# Patient Record
Sex: Female | Born: 1970 | Race: White | Hispanic: No
Health system: Southern US, Community
[De-identification: ages and names within clinical notes are randomized; demographics above are authoritative.]

---

## 2022-05-24 ENCOUNTER — Other Ambulatory Visit: Payer: Self-pay | Admitting: Orthopedic Surgery

## 2022-05-24 ENCOUNTER — Other Ambulatory Visit (HOSPITAL_COMMUNITY): Payer: Self-pay | Admitting: Orthopedic Surgery

## 2022-05-24 DIAGNOSIS — T84031A Mechanical loosening of internal left hip prosthetic joint, initial encounter: Secondary | ICD-10-CM

## 2022-05-24 DIAGNOSIS — T85698A Other mechanical complication of other specified internal prosthetic devices, implants and grafts, initial encounter: Secondary | ICD-10-CM

## 2022-06-01 ENCOUNTER — Encounter (HOSPITAL_COMMUNITY)
Admission: RE | Admit: 2022-06-01 | Discharge: 2022-06-01 | Disposition: A | Discharge status: Home / Self Care | Payer: 59 | Source: Ambulatory Visit | Attending: Orthopedic Surgery | Admitting: Orthopedic Surgery

## 2022-06-01 DIAGNOSIS — T85698A Other mechanical complication of other specified internal prosthetic devices, implants and grafts, initial encounter: Secondary | ICD-10-CM | POA: Insufficient documentation

## 2022-06-01 DIAGNOSIS — T84031A Mechanical loosening of internal left hip prosthetic joint, initial encounter: Secondary | ICD-10-CM | POA: Diagnosis present

## 2022-06-01 MED ORDER — TECHNETIUM TC 99M MEDRONATE IV KIT
20.0000 | PACK | Freq: Once | INTRAVENOUS | Status: AC | PRN
  Administered 2022-06-01: 20 via INTRAVENOUS

## 2022-09-04 ENCOUNTER — Other Ambulatory Visit (HOSPITAL_COMMUNITY): Payer: Self-pay | Admitting: Orthopedic Surgery

## 2022-09-04 ENCOUNTER — Other Ambulatory Visit: Payer: Self-pay | Admitting: Orthopedic Surgery

## 2022-09-04 DIAGNOSIS — T84031S Mechanical loosening of internal left hip prosthetic joint, sequela: Secondary | ICD-10-CM

## 2022-09-13 ENCOUNTER — Ambulatory Visit (HOSPITAL_COMMUNITY)
Admission: RE | Admit: 2022-09-13 | Discharge: 2022-09-13 | Disposition: A | Discharge status: Home / Self Care | Payer: 59 | Source: Ambulatory Visit | Attending: Orthopedic Surgery | Admitting: Orthopedic Surgery

## 2022-09-13 ENCOUNTER — Encounter (HOSPITAL_COMMUNITY)
Admission: RE | Admit: 2022-09-13 | Discharge: 2022-09-13 | Disposition: A | Discharge status: Home / Self Care | Payer: 59 | Source: Ambulatory Visit | Attending: Orthopedic Surgery | Admitting: Orthopedic Surgery

## 2022-09-13 DIAGNOSIS — T84031S Mechanical loosening of internal left hip prosthetic joint, sequela: Secondary | ICD-10-CM | POA: Insufficient documentation

## 2022-09-13 DIAGNOSIS — T84031A Mechanical loosening of internal left hip prosthetic joint, initial encounter: Secondary | ICD-10-CM | POA: Insufficient documentation

## 2022-09-13 DIAGNOSIS — X58XXXA Exposure to other specified factors, initial encounter: Secondary | ICD-10-CM | POA: Diagnosis not present

## 2022-09-13 MED ORDER — TECHNETIUM TC 99M MEDRONATE IV KIT
20.5000 | PACK | Freq: Once | INTRAVENOUS | Status: AC | PRN
  Administered 2022-09-13: 20.5 via INTRAVENOUS

## 2023-01-05 ENCOUNTER — Other Ambulatory Visit: Payer: Self-pay | Admitting: Orthopedic Surgery

## 2023-01-05 DIAGNOSIS — T148XXA Other injury of unspecified body region, initial encounter: Secondary | ICD-10-CM

## 2023-01-19 ENCOUNTER — Ambulatory Visit
Admission: RE | Admit: 2023-01-19 | Discharge: 2023-01-19 | Disposition: A | Discharge status: Home / Self Care | Payer: 59 | Source: Ambulatory Visit | Attending: Orthopedic Surgery | Admitting: Orthopedic Surgery

## 2023-01-19 ENCOUNTER — Encounter: Payer: Self-pay | Admitting: Orthopedic Surgery

## 2023-01-19 DIAGNOSIS — T148XXA Other injury of unspecified body region, initial encounter: Secondary | ICD-10-CM

## 2023-02-26 ENCOUNTER — Encounter: Payer: Self-pay | Admitting: Neurology

## 2023-02-26 ENCOUNTER — Other Ambulatory Visit: Payer: Self-pay

## 2023-02-26 DIAGNOSIS — R202 Paresthesia of skin: Secondary | ICD-10-CM

## 2023-03-22 ENCOUNTER — Ambulatory Visit (INDEPENDENT_AMBULATORY_CARE_PROVIDER_SITE_OTHER): Payer: 59 | Admitting: Neurology

## 2023-03-22 DIAGNOSIS — R202 Paresthesia of skin: Secondary | ICD-10-CM | POA: Diagnosis not present

## 2023-03-22 NOTE — Procedures (Signed)
  [facility]  [place], [place]  [place], [place] [address] Tel: [phone] Fax: [phone] Test Date:  03/22/2023  Patient: Katie Washington DOB: June 29, 1971 Physician: Narda Amber, DO  Sex: Female Height: 5\' 5"  Ref Phys: Charlies Constable, MD  ID#: [id number]   Technician:    History: This is a 52 year old female referred for evaluation of left thigh pain.  NCV & EMG Findings: Extensive electrodiagnostic testing of the left lower extremity shows:  Left sural, superficial peroneal, and lateral femoral cutaneous sensory responses are within normal limits. Left peroneal and tibial motor responses are within normal limits. Left tibial H reflex study is within normal limits. There is no evidence of active or chronic motor axonal loss changes affecting any of the tested muscles.  Motor unit configuration and recruitment pattern is within normal limits.  Impression: This is a normal study of the left lower extremity.  In particular, there is no evidence of a lumbosacral radiculopathy, lateral femoral cutaneous sensory neuropathy, or large fiber sensorimotor polyneuropathy.   ___________________________ Narda Amber, DO    Nerve Conduction Studies   Stim Site NR Peak (ms) Norm Peak (ms) O-P Amp (V) Norm O-P Amp  Left Lat Femoral Cutan Anti Sensory (Lateral Thigh)  32 C  ASIS    2.5 <3.1 11.4 >10  Left Sup Peroneal Anti Sensory (Ant Lat Mall)  32 C  12 cm    1.9 <4.6 12.6 >4  Left Sural Anti Sensory (Lat Mall)  32 C  Calf    2.6 <4.6 12.5 >4     Stim Site NR Onset (ms) Norm Onset (ms) O-P Amp (mV) Norm O-P Amp Site1 Site2 Delta-0 (ms) Dist (cm) Vel (m/s) Norm Vel (m/s)  Left Peroneal Motor (Ext Dig Brev)  32 C  Ankle    3.1 <6.0 2.6 >2.5 B Fib Ankle 7.7 38.0 49 >40  B Fib    10.8  2.3  Poplt B Fib 1.5 8.0 53 >40  Poplt    12.3  2.0         Left Tibial Motor (Abd Hall Brev)  32 C  Ankle    3.1 <6.0 21.3 >4 Knee Ankle 8.5 43.0 51 >40  Knee    11.6   17.0          Electromyography   Side Muscle Ins.Act Fibs Fasc Recrt Amp Dur Poly Activation Comment  Left AntTibialis Nml Nml Nml Nml Nml Nml Nml Nml N/A  Left Gastroc Nml Nml Nml Nml Nml Nml Nml Nml N/A  Left Flex Dig Long Nml Nml Nml Nml Nml Nml Nml Nml N/A  Left GluteusMed Nml Nml Nml Nml Nml Nml Nml Nml N/A  Left VastusLat Nml Nml Nml Nml Nml Nml Nml Nml N/A      Waveforms:

## 2023-04-05 ENCOUNTER — Encounter: Payer: 59 | Admitting: Neurology

## 2023-06-26 ENCOUNTER — Other Ambulatory Visit: Payer: Self-pay | Admitting: Obstetrics and Gynecology

## 2023-06-26 DIAGNOSIS — Z1231 Encounter for screening mammogram for malignant neoplasm of breast: Secondary | ICD-10-CM

## 2023-08-15 ENCOUNTER — Inpatient Hospital Stay: Admission: RE | Admit: 2023-08-15 | Payer: 59 | Source: Ambulatory Visit
